# Patient Record
Sex: Female | Born: 1975 | Race: Black or African American | Hispanic: No | Marital: Single | Smoking: Never smoker
Health system: Southern US, Community
[De-identification: ages and names within clinical notes are randomized; demographics above are authoritative.]

## PROBLEM LIST (undated history)

## (undated) DIAGNOSIS — I10 Essential (primary) hypertension: Secondary | ICD-10-CM

---

## 2005-02-12 ENCOUNTER — Emergency Department (HOSPITAL_COMMUNITY): Admission: EM | Admit: 2005-02-12 | Discharge: 2005-02-12 | Payer: Self-pay | Admitting: Emergency Medicine

## 2012-11-29 ENCOUNTER — Emergency Department: Payer: Self-pay | Admitting: Emergency Medicine

## 2012-11-29 LAB — RAPID INFLUENZA A&B ANTIGENS

## 2016-08-17 ENCOUNTER — Emergency Department
Admission: EM | Admit: 2016-08-17 | Discharge: 2016-08-17 | Disposition: A | Discharge status: Home / Self Care | Payer: Self-pay | Attending: Emergency Medicine | Admitting: Emergency Medicine

## 2016-08-17 ENCOUNTER — Encounter: Payer: Self-pay | Admitting: Emergency Medicine

## 2016-08-17 ENCOUNTER — Emergency Department: Payer: Self-pay

## 2016-08-17 DIAGNOSIS — R0789 Other chest pain: Secondary | ICD-10-CM | POA: Insufficient documentation

## 2016-08-17 DIAGNOSIS — R079 Chest pain, unspecified: Secondary | ICD-10-CM

## 2016-08-17 DIAGNOSIS — I10 Essential (primary) hypertension: Secondary | ICD-10-CM | POA: Insufficient documentation

## 2016-08-17 HISTORY — DX: Essential (primary) hypertension: I10

## 2016-08-17 LAB — CBC
HCT: 36.3 % (ref 35.0–47.0)
HEMOGLOBIN: 11.4 g/dL — AB (ref 12.0–16.0)
MCH: 22.7 pg — AB (ref 26.0–34.0)
MCHC: 31.3 g/dL — AB (ref 32.0–36.0)
MCV: 72.6 fL — ABNORMAL LOW (ref 80.0–100.0)
PLATELETS: 301 10*3/uL (ref 150–440)
RBC: 5.01 MIL/uL (ref 3.80–5.20)
RDW: 18.7 % — ABNORMAL HIGH (ref 11.5–14.5)
WBC: 8.7 10*3/uL (ref 3.6–11.0)

## 2016-08-17 LAB — BASIC METABOLIC PANEL
ANION GAP: 6 (ref 5–15)
BUN: 13 mg/dL (ref 6–20)
CALCIUM: 9.3 mg/dL (ref 8.9–10.3)
CO2: 25 mmol/L (ref 22–32)
CREATININE: 0.8 mg/dL (ref 0.44–1.00)
Chloride: 107 mmol/L (ref 101–111)
Glucose, Bld: 95 mg/dL (ref 65–99)
Potassium: 3.8 mmol/L (ref 3.5–5.1)
Sodium: 138 mmol/L (ref 135–145)

## 2016-08-17 LAB — TROPONIN I

## 2016-08-17 NOTE — ED Provider Notes (Signed)
[facility] Emergency Department Provider Note   ____________________________________________   First MD Initiated Contact with Patient 08/17/16 1354     (approximate)  I have reviewed the triage vital signs and the nursing notes.   HISTORY  Chief Complaint Chest Pain    HPI Rita Salazar is a 40 y.o. female who reports she was at work yesterday sitting had a sharp pain starting in the left axilla that went down the left side of her chest and under the left breast. It went away and then came back again this morning she's had several some sharp shooting pains like that each of the last 1-2 seconds and go away they usually returned come back over and over again. Sometimes she has a little pain in her arm when she moves her arm but not otherwise. She has no shortness of breath no pain with deep breathing no nausea and no other symptoms at all. Pain is moderate in nature.   Past Medical History:  Diagnosis Date  . Hypertension     There are no active problems to display for this patient.   History reviewed. No pertinent surgical history.  Prior to Admission medications   Medication Sig Start Date End Date Taking? Authorizing Provider  ibuprofen (ADVIL,MOTRIN) 200 MG tablet Take 200 mg by mouth every 6 (six) hours as needed.   Yes Historical Provider, MD    Allergies Review of patient's allergies indicates no known allergies.  History reviewed. No pertinent family history.  Social History Social History  Substance Use Topics  . Smoking status: Never Smoker  . Smokeless tobacco: Never Used  . Alcohol use No    Review of Systems Constitutional: No fever/chills Eyes: No visual changes. ENT: No sore throat. Cardiovascular:  chest pain. Respiratory: Denies shortness of breath. Gastrointestinal: No abdominal pain.  No nausea, no vomiting.  No diarrhea.  No constipation. Genitourinary: Negative for dysuria. Musculoskeletal: Negative for back  pain. Skin: Negative for rash. Neurological: Negative for headaches, focal weakness or numbness.  10-point ROS otherwise negative.  ____________________________________________   PHYSICAL EXAM:  VITAL SIGNS: ED Triage Vitals  Enc Vitals Group     BP 08/17/16 1323 (!) 142/89     Pulse Rate 08/17/16 1323 90     Resp 08/17/16 1323 18     Temp 08/17/16 1323 98.4 F (36.9 C)     Temp Source 08/17/16 1323 Oral     SpO2 08/17/16 1323 100 %     Weight 08/17/16 1324 235 lb (106.6 kg)     Height 08/17/16 1324 5\' 5"  (1.651 m)     Head Circumference --      Peak Flow --      Pain Score 08/17/16 1328 5     Pain Loc --      Pain Edu? --      Excl. in GC? --     Constitutional: Alert and oriented. Well appearing and in no acute distress. Eyes: Conjunctivae are normal. PERRL. EOMI. Head: Atraumatic. Nose: No congestion/rhinnorhea. Mouth/Throat: Mucous membranes are moist.  Oropharynx non-erythematous. Neck: No stridor.  Cardiovascular: Normal rate, regular rhythm. Grossly normal heart sounds.  Good peripheral circulation. Respiratory: Normal respiratory effort.  No retractions. Lungs CTAB.There is a little bit of tenderness on palpation of the left chest in the area of the left anterior axillary line. Otherwise no tenderness Gastrointestinal: Soft and nontender. No distention. No abdominal bruits. No CVA tenderness. Musculoskeletal: No lower extremity tenderness nor edema.  No  joint effusions. Neurologic:  Normal speech and language. No gross focal neurologic deficits are appreciated. No gait instability. Skin:  Skin is warm, dry and intact. No rash noted. Psychiatric: Mood and affect are normal. Speech and behavior are normal.  ____________________________________________   LABS (all labs ordered are listed, but only abnormal results are displayed)  Labs Reviewed  CBC - Abnormal; Notable for the following:       Result Value   Hemoglobin 11.4 (*)    MCV 72.6 (*)    MCH 22.7  (*)    MCHC 31.3 (*)    RDW 18.7 (*)    All other components within normal limits  BASIC METABOLIC PANEL  TROPONIN I  TROPONIN I   ____________________________________________  EKG  EKG read and interpreted by me shows normal sinus rhythm rate of 97 left axis no acute ST-T wave changes there are markedly decreased R-wave progress in the precordial leads. ____________________________________________  RADIOLOGY EXAM: CHEST  2 VIEW  COMPARISON:  None.  FINDINGS: Lungs are clear.  No pleural effusion or pneumothorax.  The heart is normal in size.  Visualized osseous structures are within normal limits.  IMPRESSION: Normal chest radiographs.   Electronically Signed   By: Charline Bills M.D.   On: 08/17/2016 14:33   ____________________________________________   PROCEDURES  Procedure(s) performed:   Procedures  Critical Care performed:   ____________________________________________   INITIAL IMPRESSION / ASSESSMENT AND PLAN / ED COURSE  Pertinent labs & imaging results that were available during my care of the patient were reviewed by me and considered in my medical decision making (see chart for details).    Clinical Course     ____________________________________________   FINAL CLINICAL IMPRESSION(S) / ED DIAGNOSES  Final diagnoses:  Chest pain, unspecified chest pain type      NEW MEDICATIONS STARTED DURING THIS VISIT:  Discharge Medication List as of 08/17/2016  5:36 PM       Note:  This document was prepared using Dragon voice recognition software and may include unintentional dictation errors.    Arnaldo Natal, MD 08/17/16 [phone]

## 2016-08-17 NOTE — Discharge Instructions (Signed)
The heart blood tests that done are normal. I'm not sure what's causing the pain that you're having. It does not sound like the typical kind of heart pain. I will ask you to follow-up with your regular doctor sometime in the next 2 or 3 days. Please return here if you're worse if the pain becomes heavy tight or crushing or if you get a lot of shortness of breath or sweating. Please also call Dr. Duke Salviaandolph the cardiologist to arrange follow-up they should be on to see you in the next few days. It might be easier to have them see within the next few days than your primary care doc.

## 2016-08-17 NOTE — ED Triage Notes (Signed)
Pt arrived to ED with c/o of chest pain that started yesterday. Pt states the pain is under her left breast and shoots toward her nipple.

## 2016-08-17 NOTE — ED Notes (Signed)
Troponin collected at this time. NAD noted. Explained to patient the reason more blood was needed for 2nd test. Pt states understanding. Pt denies any needs at this time. Will continue to monitor for further patient needs.

## 2016-08-17 NOTE — ED Notes (Signed)
This RN apologized to patient for delay. Pt states understanding, pt and friend at bedside both given warm blankets. Denies any further needs. Pt is alert and oriented. Will continue to monitor for further patient needs. Reinforced call bell for further needs. Pt states understanding.

## 2020-10-19 DIAGNOSIS — I10 Essential (primary) hypertension: Secondary | ICD-10-CM | POA: Diagnosis not present

## 2020-10-19 DIAGNOSIS — Z20822 Contact with and (suspected) exposure to covid-19: Secondary | ICD-10-CM | POA: Diagnosis not present

## 2020-10-19 DIAGNOSIS — T783XXA Angioneurotic edema, initial encounter: Secondary | ICD-10-CM | POA: Insufficient documentation

## 2020-10-19 DIAGNOSIS — Z79899 Other long term (current) drug therapy: Secondary | ICD-10-CM | POA: Diagnosis not present

## 2020-10-19 DIAGNOSIS — R091 Pleurisy: Secondary | ICD-10-CM | POA: Diagnosis not present

## 2020-10-19 DIAGNOSIS — R059 Cough, unspecified: Secondary | ICD-10-CM | POA: Diagnosis not present

## 2020-10-19 DIAGNOSIS — R220 Localized swelling, mass and lump, head: Secondary | ICD-10-CM | POA: Diagnosis present

## 2020-10-20 ENCOUNTER — Emergency Department: Payer: BC Managed Care – PPO

## 2020-10-20 ENCOUNTER — Emergency Department
Admission: EM | Admit: 2020-10-20 | Discharge: 2020-10-20 | Disposition: A | Discharge status: Home / Self Care | Payer: BC Managed Care – PPO | Attending: Emergency Medicine | Admitting: Emergency Medicine

## 2020-10-20 ENCOUNTER — Encounter: Payer: Self-pay | Admitting: *Deleted

## 2020-10-20 ENCOUNTER — Other Ambulatory Visit: Payer: Self-pay

## 2020-10-20 DIAGNOSIS — T783XXA Angioneurotic edema, initial encounter: Secondary | ICD-10-CM

## 2020-10-20 DIAGNOSIS — R091 Pleurisy: Secondary | ICD-10-CM

## 2020-10-20 DIAGNOSIS — R059 Cough, unspecified: Secondary | ICD-10-CM

## 2020-10-20 LAB — BASIC METABOLIC PANEL
Anion gap: 11 (ref 5–15)
BUN: 8 mg/dL (ref 6–20)
CO2: 20 mmol/L — ABNORMAL LOW (ref 22–32)
Calcium: 9 mg/dL (ref 8.9–10.3)
Chloride: 103 mmol/L (ref 98–111)
Creatinine, Ser: 0.61 mg/dL (ref 0.44–1.00)
GFR, Estimated: >60 mL/min (ref >60)
Glucose, Bld: 103 mg/dL — ABNORMAL HIGH (ref 70–99)
Potassium: 4.2 mmol/L (ref 3.5–5.1)
Sodium: 134 mmol/L — ABNORMAL LOW (ref 135–145)

## 2020-10-20 LAB — CBC WITH DIFFERENTIAL/PLATELET
Abs Immature Granulocytes: 0.02 10*3/uL (ref 0.00–0.07)
Basophils Absolute: 0 10*3/uL (ref 0.0–0.1)
Basophils Relative: 0 %
Eosinophils Absolute: 0.1 10*3/uL (ref 0.0–0.5)
Eosinophils Relative: 1 %
HCT: 39.2 % (ref 36.0–46.0)
Hemoglobin: 12.2 g/dL (ref 12.0–15.0)
Immature Granulocytes: 0 %
Lymphocytes Relative: 37 %
Lymphs Abs: 3.7 10*3/uL (ref 0.7–4.0)
MCH: 24.8 pg — ABNORMAL LOW (ref 26.0–34.0)
MCHC: 31.1 g/dL (ref 30.0–36.0)
MCV: 79.8 fL — ABNORMAL LOW (ref 80.0–100.0)
Monocytes Absolute: 0.9 10*3/uL (ref 0.1–1.0)
Monocytes Relative: 9 %
Neutro Abs: 5.3 10*3/uL (ref 1.7–7.7)
Neutrophils Relative %: 53 %
Platelets: 358 10*3/uL (ref 150–400)
RBC: 4.91 MIL/uL (ref 3.87–5.11)
RDW: 14.3 % (ref 11.5–15.5)
WBC: 10.1 10*3/uL (ref 4.0–10.5)
nRBC: 0 % (ref 0.0–0.2)

## 2020-10-20 LAB — RESP PANEL BY RT-PCR (RSV, FLU A&B, COVID)  RVPGX2
Influenza A by PCR: NEGATIVE
Influenza B by PCR: NEGATIVE
Resp Syncytial Virus by PCR: NEGATIVE
SARS Coronavirus 2 by RT PCR: NEGATIVE

## 2020-10-20 LAB — TROPONIN I (HIGH SENSITIVITY): Troponin I (High Sensitivity): 5 ng/L (ref <18)

## 2020-10-20 LAB — FIBRIN DERIVATIVES D-DIMER (ARMC ONLY): Fibrin derivatives D-dimer (ARMC): 1581.04 ng/mL (FEU) — ABNORMAL HIGH (ref 0.00–499.00)

## 2020-10-20 IMAGING — CR DG CHEST 2V
1 series · 2 of 2 positions shown · non-contrast
Comparison: [DATE]

CLINICAL DATA: Cough, lower lip swelling, bilateral rib pain, and
chest pain.

EXAM:
CHEST - 2 VIEW

[Series 1: dg chest 2 view · 0.14mm/px · 2 of 2 slices shown]
[im 1/2]
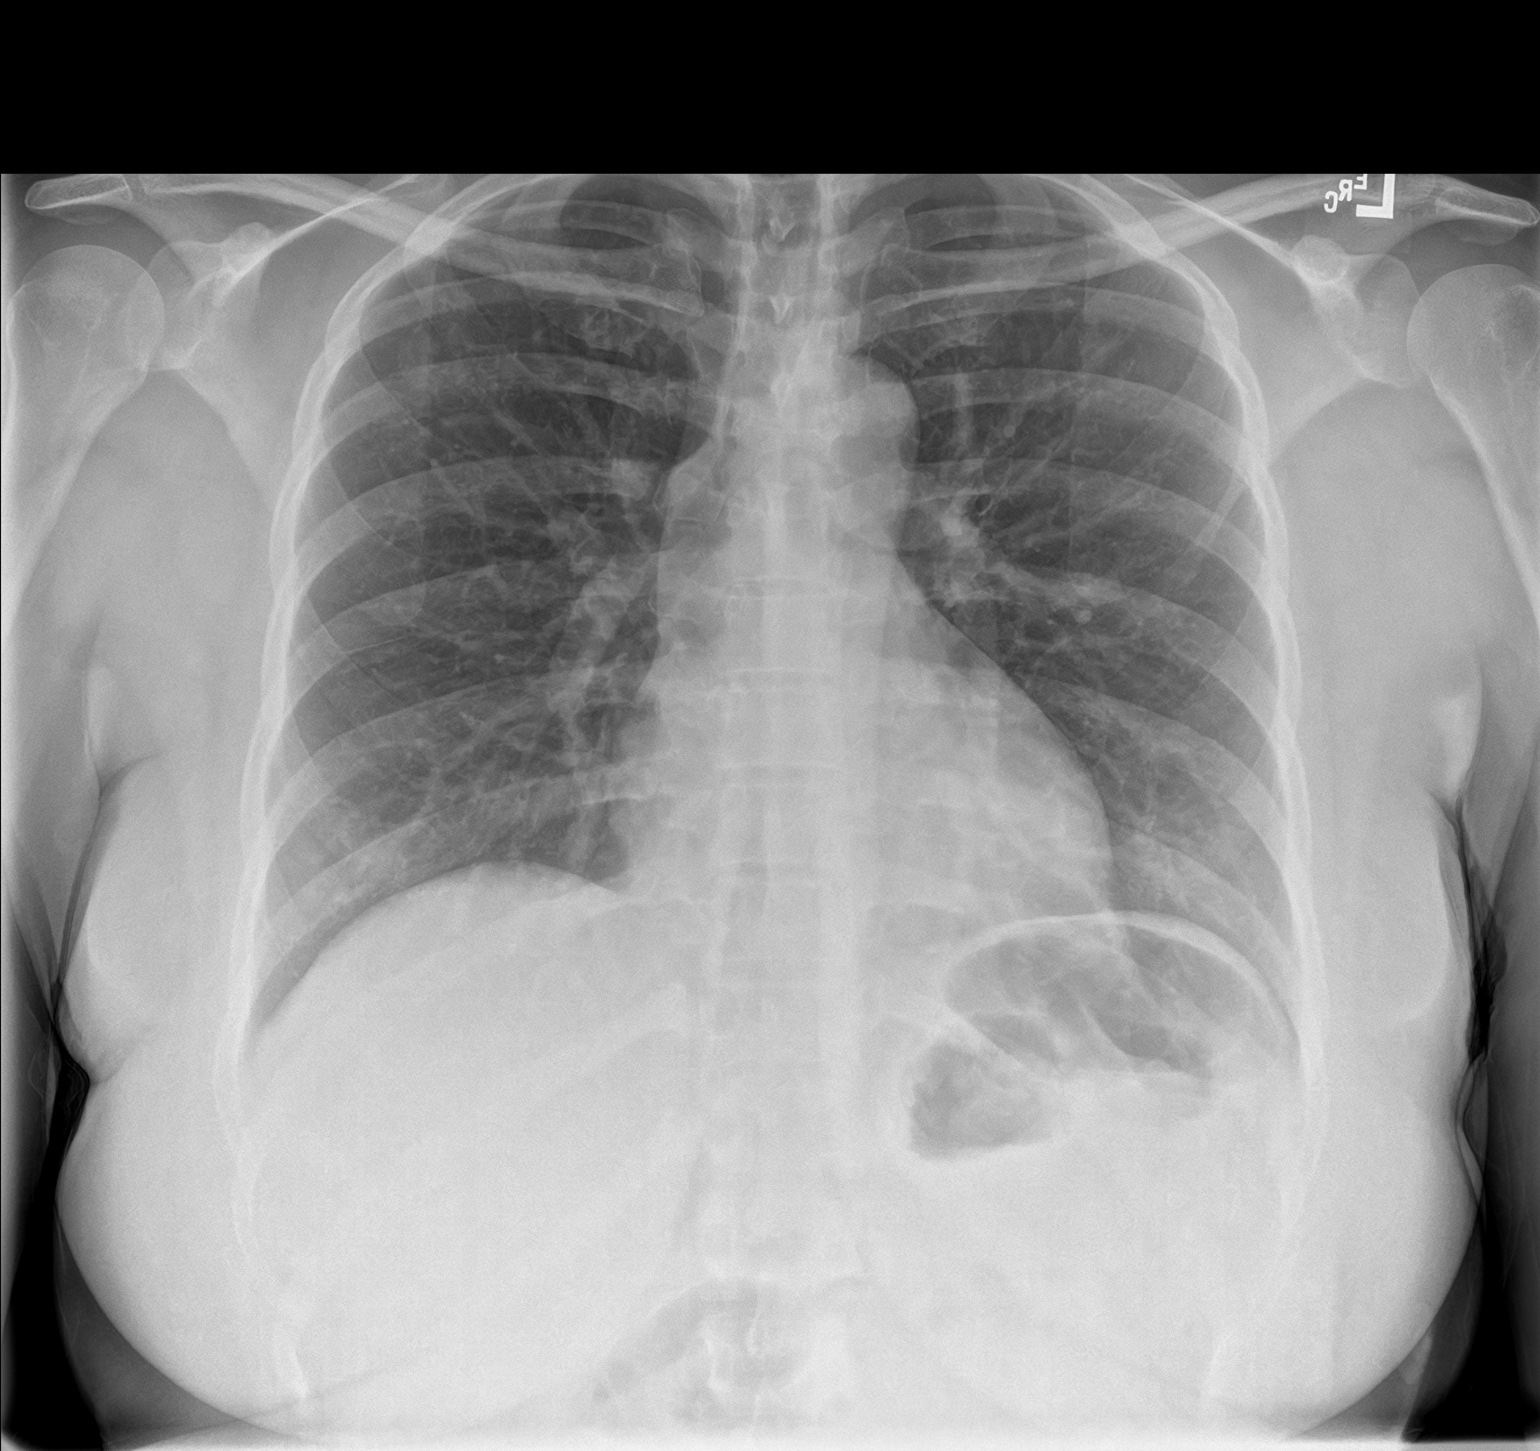
[im 2/2]
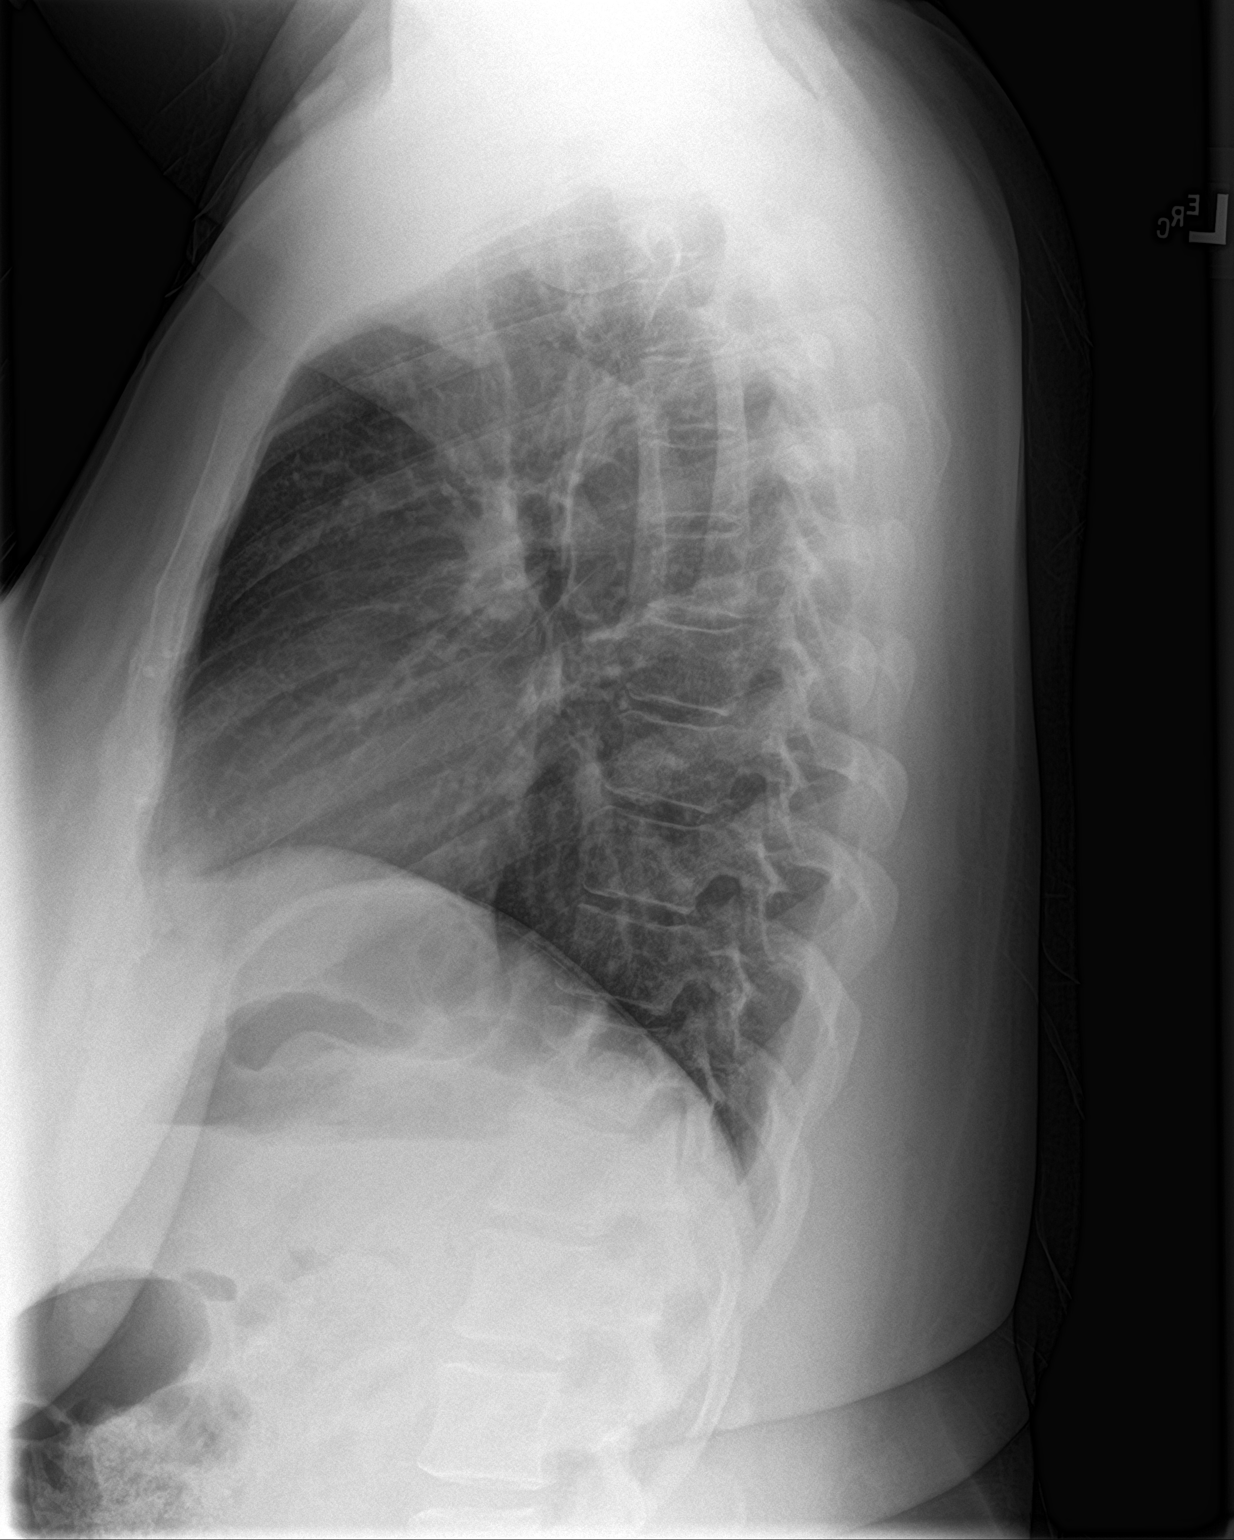

[2 of 2 positions shown; findings below may reference images not displayed]

FINDINGS: The heart size and mediastinal contours are within normal limits.
Both lungs are clear. The visualized skeletal structures are
unremarkable.
IMPRESSION: No active cardiopulmonary disease.

## 2020-10-20 MED ORDER — METHYLPREDNISOLONE SODIUM SUCC 125 MG IJ SOLR
125.0000 mg | Freq: Once | INTRAMUSCULAR | Status: AC
  Administered 2020-10-20: 125 mg via INTRAVENOUS
  Filled 2020-10-20: qty 2

## 2020-10-20 MED ORDER — FAMOTIDINE IN NACL 20-0.9 MG/50ML-% IV SOLN
20.0000 mg | Freq: Once | INTRAVENOUS | Status: AC
  Administered 2020-10-20: 20 mg via INTRAVENOUS
  Filled 2020-10-20: qty 50

## 2020-10-20 MED ORDER — CHLORTHALIDONE 25 MG PO TABS: 25.0000 mg | ORAL_TABLET | Freq: Every day | ORAL | 1 refills | Status: AC

## 2020-10-20 MED ORDER — IOHEXOL 350 MG/ML SOLN
100.0000 mL | Freq: Once | INTRAVENOUS | Status: AC | PRN
  Administered 2020-10-20: 100 mL via INTRAVENOUS

## 2020-10-20 MED ORDER — DIPHENHYDRAMINE HCL 50 MG/ML IJ SOLN
50.0000 mg | Freq: Once | INTRAMUSCULAR | Status: AC
  Administered 2020-10-20: 50 mg via INTRAVENOUS
  Filled 2020-10-20: qty 1

## 2020-10-20 MED ORDER — TRANEXAMIC ACID-NACL 1000-0.7 MG/100ML-% IV SOLN
1000.0000 mg | INTRAVENOUS | Status: AC
  Administered 2020-10-20: 1000 mg via INTRAVENOUS
  Filled 2020-10-20: qty 100

## 2020-10-20 MED ORDER — CHLORTHALIDONE 25 MG PO TABS: 25.0000 mg | ORAL_TABLET | Freq: Every day | ORAL | 1 refills | Status: DC

## 2020-10-20 NOTE — ED Notes (Signed)
Patient transported to X-ray 

## 2020-10-20 NOTE — ED Notes (Signed)
Patient verbalizes understanding of discharge instructions. Opportunity for questioning and answers were provided. Armband removed by staff, pt discharged from ED. Ambulated out to lobby  

## 2020-10-20 NOTE — ED Triage Notes (Signed)
Pt c/o swelling in lower lip, bilateral rib pain and intermittent central chest pain. Pt is ambulatory and in no acute respiratory distress. Pt does not have hives, swelling of tongue, inner mouth, or throat. Pt denies n/v at this time. Pt has taken Benadryl 25 mg PO x 2 tabs prior to arrival. Pt states swelling first noted at 22:30 tonight.

## 2020-10-20 NOTE — ED Provider Notes (Signed)
[facility] Emergency Department Provider Note  ____________________________________________  Time seen: Approximately 3:59 AM  I have reviewed the triage vital signs and the nursing notes.   HISTORY  Chief Complaint Allergic Reaction   HPI Rita Salazar is a 44 y.o. female with a history of hypertension and iron deficiency anemia who presents for evaluation of lip swelling and chest pain.  Patient's most pressing concern which brought her to the emergency room today was lip swelling which started 10:30 PM this evening.  Patient is on lisinopril.  Has been on it for a few months.  She denies tongue swelling, difficulty speaking or swallowing, hives, or any new medications.  No prior history of angioedema before lisinopril.  Her second complaint is chest soreness.  Patient reports that last week she had cold-like symptoms after being exposed to some sick children.  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  She has had a mild cough that has been persistent.  She is complaining of soreness of her chest associated with it.  She denies any personal or family history of blood clot, she denies pleuritic chest pain, she denies shortness of breath.  She does take birth control.  She denies hemoptysis.  She denies leg pain or swelling, or recent prolonged immobilization.   Past Medical History:  Diagnosis Date  . Hypertension     There are no problems to display for this patient.   History reviewed. No pertinent surgical history.  Prior to Admission medications   Medication Sig Start Date End Date Taking? Authorizing Provider  amLODipine (NORVASC) 5 MG tablet Take 5 mg by mouth daily.   Yes [provider]  lisinopril (ZESTRIL) 5 MG tablet Take 5 mg by mouth daily.  10/20/20 Yes [provider]    chlorthalidone (HYGROTON) 25 MG tablet Take 1 tablet (25 mg total) by mouth daily. 10/20/20   Nita Sickle, MD  ibuprofen (ADVIL,MOTRIN) 200 MG tablet Take 200 mg by mouth every 6 (six) hours as needed.    [provider]    Allergies Lisinopril  History reviewed. No pertinent family history.  Social History Social History   Tobacco Use  . Smoking status: Never Smoker  . Smokeless tobacco: Never Used  Vaping Use  . Vaping Use: Never used  Substance Use Topics  . Alcohol use: No  . Drug use: Yes    Types: Marijuana    Comment: RARELY    Review of Systems  Constitutional: Negative for fever. Eyes: Negative for visual changes. ENT: Negative for sore throat. + lip swelling Neck: No neck pain  Cardiovascular: Negative for chest pain. + chest soreness Respiratory: Negative for shortness of breath. + cough Gastrointestinal: Negative for abdominal pain, vomiting or diarrhea. Genitourinary: Negative for dysuria. Musculoskeletal: Negative for back pain. Skin: Negative for rash. Neurological: Negative for headaches, weakness or numbness. Psych: No SI or HI  ____________________________________________   PHYSICAL EXAM:  VITAL SIGNS: Vitals:   10/20/20 0645 10/20/20 0700  BP:  (!) 136/95  Pulse: 89 100  Resp: (!) 21 (!) 23  Temp:    SpO2: 98% 97%    Constitutional: Alert and oriented. Mild cough HEENT:      Head: Normocephalic and atraumatic.         Eyes: Conjunctivae are normal. Sclera is non-icteric.  Mouth/Throat: Mucous membranes are moist. Lower lip swelling, oropharynx is clear with patent airway, no stridor, tongue and uvula are normal with no swelling      Neck: Supple with no signs of meningismus. Cardiovascular: Regular rate and rhythm. No murmurs, gallops, or rubs.  Respiratory: Normal respiratory effort. Lungs are clear to auscultation bilaterally.  Gastrointestinal: Soft, non tender. Musculoskeletal: No edema, cyanosis, or erythema  of extremities. Neurologic: Normal speech and language. Face is symmetric. Moving all extremities. No gross focal neurologic deficits are appreciated. Skin: Skin is warm, dry and intact. No rash noted. Psychiatric: Mood and affect are normal. Speech and behavior are normal.  ____________________________________________   LABS (all labs ordered are listed, but only abnormal results are displayed)  Labs Reviewed  CBC WITH DIFFERENTIAL/PLATELET - Abnormal; Notable for the following components:      Result Value   MCV 79.8 (*)    MCH 24.8 (*)    All other components within normal limits  BASIC METABOLIC PANEL - Abnormal; Notable for the following components:   Sodium 134 (*)    CO2 20 (*)    Glucose, Bld 103 (*)    All other components within normal limits  FIBRIN DERIVATIVES D-DIMER ([facility] ONLY) - Abnormal; Notable for the following components:   Fibrin derivatives D-dimer ([facility]) 1,581.04 (*)    All other components within normal limits  RESP PANEL BY RT-PCR (RSV, FLU A&B, COVID)  [id number]  TROPONIN I (HIGH SENSITIVITY)   ____________________________________________  EKG  ED ECG REPORT I, Nita Sickle, the attending physician, personally viewed and interpreted this ECG.  Sinus tachycardia, rate of 101, normal intervals, normal axis, no ST elevations or depressions.  Otherwise normal EKG. ____________________________________________  RADIOLOGY  I have personally reviewed the images performed during this visit and I agree with the Radiologist's read.   Interpretation by Radiologist:  DG Chest 2 View  Result Date: 10/20/2020 CLINICAL DATA:  Cough, lower lip swelling, bilateral rib pain, and chest pain. EXAM: CHEST - 2 VIEW COMPARISON:  08/17/2016 FINDINGS: The heart size and mediastinal contours are within normal limits. Both lungs are clear. The visualized skeletal structures are unremarkable. IMPRESSION: No active cardiopulmonary disease. Electronically Signed   By:  Burman Nieves M.D.   On: 10/20/2020 03:55   CT Angio Chest PE W and/or Wo Contrast  Result Date: 10/20/2020 CLINICAL DATA:  Bilateral ribs and central chest pain. Positive D-dimer. Pulmonary embolus is suspected with low to intermediate probability. EXAM: CT ANGIOGRAPHY CHEST WITH CONTRAST TECHNIQUE: Multidetector CT imaging of the chest was performed using the standard protocol during bolus administration of intravenous contrast. Multiplanar CT image reconstructions and MIPs were obtained to evaluate the vascular anatomy. CONTRAST:  OMNIPAQUE IOHEXOL 350 MG/ML SOLN COMPARISON:  Chest radiograph 10/20/2020 FINDINGS: Cardiovascular: Good opacification of the central and segmental pulmonary arteries. No focal filling defects. No evidence of significant pulmonary embolus. Normal heart size. No pericardial effusions. Normal caliber thoracic aorta. No aortic dissection. Great vessel origins are patent. Mediastinum/Nodes: No enlarged mediastinal, hilar, or axillary lymph nodes. Thyroid gland, trachea, and esophagus demonstrate no significant findings. Lungs/Pleura: Lungs are clear. No pleural effusion or pneumothorax. Upper Abdomen: No acute abnormalities demonstrated in the visualized upper abdomen. Musculoskeletal: No chest wall abnormality. No acute or significant osseous findings. Review of the MIP images confirms the above findings. IMPRESSION: 1. No evidence of significant pulmonary embolus. 2. No evidence of active pulmonary disease. Electronically Signed   By: Burman Nieves M.D.   On: 10/20/2020 05:28  ____________________________________________   PROCEDURES  Procedure(s) performed:yes .1-3 Lead EKG Interpretation Performed by: Nita Sickle, MD Authorized by: Nita Sickle, MD     Interpretation: normal     ECG rate assessment: normal     Rhythm: sinus rhythm     Ectopy: none     Critical Care performed:   None ____________________________________________   INITIAL IMPRESSION / ASSESSMENT AND PLAN / ED COURSE  44 y.o. female with a history of hypertension and iron deficiency anemia who presents for evaluation of lip swelling, chest soreness, and cough.   # lip swelling - concerning for ACE induced angioedema. Will give IV pepcid, benadryl, and solumedrol. Will give TXA if no improvement. Will discontinue ACE inhibitor and switch to chlorthalidone. Will monitor airway very closely.   # cough and chest soreness: Patient is in no obvious distress, does have a mild cough but normal work of breathing and normal sats, lungs are clear to auscultation.  Cough has been lingering since cold symptoms that patient had last week.  She has had an exposure to Covid although she is fully vaccinated.  We will check for Covid and flu.  We will get a chest x-ray.  EKG with no signs of acute ischemic changes.  Will get a troponin to rule out myocarditis.  Pericarditis also possibility in the setting of a viral illness.  Also pleurisy in the differential diagnosis.  Low suspicion for PE however patient is slightly tachycardic and is on birth control so we will send a D-dimer.  _________________________ 6:42 AM on 10/20/2020 -----------------------------------------  Dimer was elevated and patient was sent for CT of the chest.  CT was visualized by me with no signs of pneumonia, PE, or any other acute findings.  Read was confirmed by radiology.  There is no pericardial effusion on CT. Pain most likely pleurisy vs pericarditis. Will treat with ibuprofen.  Patient's angioedema has markedly improved after IV TXA.  Discussed pretty strict return precautions with patient.  Patient will discontinue lisinopril and will switch to chlorthalidone which was sent to her pharmacy.         _____________________________________________ Please note:  Patient was evaluated in Emergency Department today for the symptoms described  in the history of present illness. Patient was evaluated in the context of the global COVID-19 pandemic, which necessitated consideration that the patient might be at risk for infection with the SARS-CoV-2 virus that causes COVID-19. Institutional protocols and algorithms that pertain to the evaluation of patients at risk for COVID-19 are in a state of rapid change based on information released by regulatory bodies including the CDC and federal and state organizations. These policies and algorithms were followed during the patient's care in the ED.  Some ED evaluations and interventions may be delayed as a result of limited staffing during the pandemic.   [place] Controlled Substance Database was reviewed by me. ____________________________________________   FINAL CLINICAL IMPRESSION(S) / ED DIAGNOSES   Final diagnoses:  Angioedema, initial encounter  Cough  Pleurisy      NEW MEDICATIONS STARTED DURING THIS VISIT:  ED Discharge Orders         Ordered    chlorthalidone (HYGROTON) 25 MG tablet  Daily,   Status:  Discontinued        10/20/20 0638    chlorthalidone (HYGROTON) 25 MG tablet  Daily        10/20/20 0713           Note:  This document was prepared using Dragon voice recognition  software and may include unintentional dictation errors.    Nita SickleVeronese, [place], MD 10/20/20 [phone]

## 2020-10-20 NOTE — Discharge Instructions (Addendum)
STOP taking your lisinopril. Do not take any other ACE inhibitors. You may continue to take your amlodipine and instead of lisinopril, please take chlorthalidone once a day which has been sent to your pharmacy.  Return to the emergency room if your swelling is getting worse, if you notice any swelling of your tongue or any difficulty swallowing or speaking.  For the chest pain, take 600mg  of ibuprofen 3 times a day with a meal or snack for the next 3-5 days. Return to the ER for new or worsening chest pain, shortness of breath or fever.

## 2020-10-20 NOTE — ED Notes (Signed)
Patient transported to CT
